# Patient Record
Sex: Male | Born: 1965 | Race: Black or African American | Hispanic: No | Marital: Married | Smoking: Never smoker
Health system: Southern US, Community
[De-identification: ages and names within clinical notes are randomized; demographics above are authoritative.]

## PROBLEM LIST (undated history)

## (undated) DIAGNOSIS — I10 Essential (primary) hypertension: Secondary | ICD-10-CM

## (undated) DIAGNOSIS — E7800 Pure hypercholesterolemia, unspecified: Secondary | ICD-10-CM

---

## 2000-07-06 ENCOUNTER — Emergency Department (HOSPITAL_COMMUNITY): Admission: EM | Admit: 2000-07-06 | Discharge: 2000-07-06 | Payer: Self-pay | Admitting: *Deleted

## 2000-07-06 ENCOUNTER — Encounter: Payer: Self-pay | Admitting: *Deleted

## 2003-08-17 ENCOUNTER — Emergency Department (HOSPITAL_COMMUNITY): Admission: EM | Admit: 2003-08-17 | Discharge: 2003-08-17 | Payer: Self-pay | Admitting: Emergency Medicine

## 2003-08-24 ENCOUNTER — Encounter: Admission: RE | Admit: 2003-08-24 | Discharge: 2003-08-24 | Payer: Self-pay | Admitting: Internal Medicine

## 2003-09-07 ENCOUNTER — Encounter: Admission: RE | Admit: 2003-09-07 | Discharge: 2003-09-07 | Payer: Self-pay | Admitting: Internal Medicine

## 2004-01-19 ENCOUNTER — Encounter: Admission: RE | Admit: 2004-01-19 | Discharge: 2004-01-19 | Payer: Self-pay | Admitting: Internal Medicine

## 2004-02-09 ENCOUNTER — Encounter: Admission: RE | Admit: 2004-02-09 | Discharge: 2004-02-09 | Payer: Self-pay | Admitting: Internal Medicine

## 2004-02-15 ENCOUNTER — Encounter: Admission: RE | Admit: 2004-02-15 | Discharge: 2004-02-15 | Payer: Self-pay | Admitting: Internal Medicine

## 2004-10-26 ENCOUNTER — Ambulatory Visit: Payer: Self-pay | Admitting: Internal Medicine

## 2004-10-26 ENCOUNTER — Inpatient Hospital Stay (HOSPITAL_COMMUNITY): Admission: AD | Admit: 2004-10-26 | Discharge: 2004-10-27 | Payer: Self-pay | Admitting: Internal Medicine

## 2004-10-26 ENCOUNTER — Ambulatory Visit: Payer: Self-pay | Admitting: Cardiology

## 2004-10-31 ENCOUNTER — Encounter (HOSPITAL_COMMUNITY): Admission: RE | Admit: 2004-10-31 | Discharge: 2004-12-21 | Payer: Self-pay | Admitting: Internal Medicine

## 2004-11-06 ENCOUNTER — Ambulatory Visit: Payer: Self-pay | Admitting: Internal Medicine

## 2005-09-08 ENCOUNTER — Emergency Department (HOSPITAL_COMMUNITY): Admission: EM | Admit: 2005-09-08 | Discharge: 2005-09-08 | Payer: Self-pay | Admitting: Emergency Medicine

## 2007-09-01 ENCOUNTER — Emergency Department (HOSPITAL_COMMUNITY): Admission: EM | Admit: 2007-09-01 | Discharge: 2007-09-01 | Payer: Self-pay | Admitting: Emergency Medicine

## 2008-09-08 ENCOUNTER — Emergency Department (HOSPITAL_COMMUNITY): Admission: EM | Admit: 2008-09-08 | Discharge: 2008-09-08 | Payer: Self-pay | Admitting: Emergency Medicine

## 2010-05-06 ENCOUNTER — Emergency Department (HOSPITAL_COMMUNITY): Admission: EM | Admit: 2010-05-06 | Discharge: 2010-05-06 | Payer: Self-pay | Admitting: Emergency Medicine

## 2010-08-02 ENCOUNTER — Encounter: Admission: RE | Admit: 2010-08-02 | Discharge: 2010-08-02 | Payer: Self-pay | Admitting: Family Medicine

## 2010-10-11 ENCOUNTER — Emergency Department (HOSPITAL_BASED_OUTPATIENT_CLINIC_OR_DEPARTMENT_OTHER): Admission: EM | Admit: 2010-10-11 | Discharge: 2010-10-11 | Payer: Self-pay | Admitting: Emergency Medicine

## 2010-10-11 ENCOUNTER — Ambulatory Visit: Payer: Self-pay | Admitting: Radiology

## 2011-03-13 LAB — DIFFERENTIAL
Basophils Relative: 1 % (ref 0–1)
Eosinophils Absolute: 0.3 10*3/uL (ref 0.0–0.7)
Lymphs Abs: 1.8 10*3/uL (ref 0.7–4.0)
Monocytes Absolute: 0.5 10*3/uL (ref 0.1–1.0)
Neutro Abs: 2.5 10*3/uL (ref 1.7–7.7)
Neutrophils Relative %: 50 % (ref 43–77)

## 2011-03-13 LAB — COMPREHENSIVE METABOLIC PANEL
ALT: 19 U/L (ref 0–53)
AST: 21 U/L (ref 0–37)
Albumin: 3.9 g/dL (ref 3.5–5.2)
CO2: 26 mEq/L (ref 19–32)
Creatinine, Ser: 1.57 mg/dL — ABNORMAL HIGH (ref 0.4–1.5)
GFR calc non Af Amer: 49 mL/min — ABNORMAL LOW (ref >60)
Potassium: 4.5 mEq/L (ref 3.5–5.1)
Total Protein: 7.9 g/dL (ref 6.0–8.3)

## 2011-03-13 LAB — URINALYSIS, ROUTINE W REFLEX MICROSCOPIC
Glucose, UA: NEGATIVE mg/dL
Ketones, ur: NEGATIVE mg/dL
Specific Gravity, Urine: 1.01 (ref 1.005–1.030)
Urobilinogen, UA: 0.2 mg/dL (ref 0.0–1.0)

## 2011-03-13 LAB — URINE MICROSCOPIC-ADD ON

## 2011-03-13 LAB — CBC
HCT: 41.4 % (ref 39.0–52.0)
Hemoglobin: 13.8 g/dL (ref 13.0–17.0)
MCHC: 33.4 g/dL (ref 30.0–36.0)
RBC: 5.49 MIL/uL (ref 4.22–5.81)

## 2011-05-11 NOTE — Discharge Summary (Signed)
Christopher Ibarra, Christopher Ibarra           ACCOUNT NO.:  [id number]   MEDICAL RECORD NO.:  [id number]          PATIENT TYPE:  INP   LOCATION:  3701                         FACILITY:  [facility]   PHYSICIAN:  Alvester Morin, M.D.  DATE OF BIRTH:  04/02/1966   DATE OF ADMISSION:  10/26/2004  DATE OF DISCHARGE:  10/27/2004                                 DISCHARGE SUMMARY   RESIDENT:  Dr. Artist Beach   DISCHARGE DIAGNOSES:  1.  Hyperthyroidism.  2.  Hypertension.  3.  Right gluteal abscess.  4.  Sinus tachycardia likely secondary to hyperthyroidism.   DISCHARGE MEDICATIONS:  1.  Atenolol 50 mg p.o. daily.  2.  Doxycycline 100 mg one p.o. b.i.d. for seven days.  3.  Lisinopril 20 mg one tablet p.o. daily.  4.  HCTZ 25 mg one tablet p.o. daily.   DISCHARGE INSTRUCTIONS:  The patient is to follow up in radiology on  October 31, 2004 at 2 p.m. and November 01, 2004 at 2 p.m. for a radioactive  iodine uptake study.  He is to follow up at the [facility]  on November 06, 2004 at 2 p.m. for a hospital discharge follow-up  appointment.   HISTORY OF PRESENT ILLNESS:  This is a 45 year old male with hypertension  who reported to the [facility] on the day of admission for  a blood pressure check following restart of his medications.  Patient had  been off antihypertensives for two weeks.  On the day of admission at his  outpatient clinic appointment patient had reported fatigue and generalized  malaise.  Was noted to have a sinus tachycardia.  Upon review of the  patient's chart patient was noted to have had a persistent tachycardia over  multiple prior visits to the outpatient clinic.  At the outpatient clinic  patient had EKG which showed sinus tachycardia with T-wave inversions in  leads V4-V6.  He was admitted for further work-up.   HOSPITAL COURSE:  #1 - HYPERTHYROIDISM:  Patient's TSH was evaluated upon  admission to the hospital and was found to be 0.006.  He  is being discharged  on a beta blocker 50 mg, p.o. atenolol daily.  He is to follow up as  previously mentioned and discharge instructions for a radioactive iodine  uptake study.  Subsequent to the study consideration will be given for  starting the patient on methimazole.  At the time of discharge free T3 and  free T4 are pending.   #2 - HYPERTENSION:  The patient's systolic blood pressure ranged from 103-  132 during admission, diastolic 88-94.  He was continued on his home dose of  hydrochlorothiazide at 25 mg p.o. daily and also on lisinopril at 20 mg p.o.  daily.  During his hospitalization he was started on metoprolol 25 mg p.o.  b.i.d. for heart rate control and for symptomatic relief of his symptoms due  to his hyperthyroidism.  As previously mentioned, he will be discharged on  atenolol and his blood pressure needs to be thoroughly assessed at his next  outpatient clinic visit on November 06, 2004.   #  3 - RIGHT GLUTEAL ABSCESS:  Patient reported on admission that he had a  roughly three-week history of a right gluteal lesion previously more  inflamed that drained pus.  This region was treated with over-the-counter  Boil-Ease.  Upon admission the lesion is indurated with minimal erythema and  a small drain hole opening.  The lesion does appear to be healing; however,  he is discharged empirically on doxycycline 100 mg p.o. b.i.d. for possible  Staph infection.   #4 - SINUS TACHYCARDIA:  Sinus tachycardia is likely secondary to the  patient's hyperthyroidism.  On admission cardiac enzymes were drawn and the  patient did initially have a mildly elevated troponin I of 0.06.  At the  time of discharge his troponin I is 0.04 with all other cardiac enzymes  within normal limits.  Patient had a mildly elevated D-dimer of 0.73;  however, his tachycardia was not felt to be likely due to a pulmonary  embolism as he was not hypoxic.  Did not complain of chest discomfort and  had been  maintaining a normal activity level on day prior to admission.  His  urine drug screen was negative for any type of stimulant medication,  specifically no cocaine and no amphetamine.  On the day of discharge  laboratories were as follows:  TSH 0.006, free T3 and free T4 pending, urine  drug screen negative, last cardiac panel cardiac enzymes within normal  limits.  At his follow-up appointment on November 14 in the [facility] the following laboratories need to be drawn:  CMET or BMET  to assess for hypokalemia as the patient has recently been restarted on his  antihypertensive medication.  Additionally, if a 2-D echocardiogram has not  been completed prior to his November 06, 2004 appointment, this does need to  be set up for him via the outpatient clinic.       HP/MEDQ  D:  10/27/2004  T:  10/28/2004  Job:  [id number]

## 2012-07-17 ENCOUNTER — Encounter (HOSPITAL_BASED_OUTPATIENT_CLINIC_OR_DEPARTMENT_OTHER): Payer: Self-pay | Admitting: Emergency Medicine

## 2012-07-17 DIAGNOSIS — R509 Fever, unspecified: Secondary | ICD-10-CM | POA: Insufficient documentation

## 2012-07-17 DIAGNOSIS — J029 Acute pharyngitis, unspecified: Secondary | ICD-10-CM | POA: Insufficient documentation

## 2012-07-17 DIAGNOSIS — R0602 Shortness of breath: Secondary | ICD-10-CM | POA: Insufficient documentation

## 2012-07-17 DIAGNOSIS — R52 Pain, unspecified: Secondary | ICD-10-CM | POA: Insufficient documentation

## 2012-07-17 DIAGNOSIS — J3489 Other specified disorders of nose and nasal sinuses: Secondary | ICD-10-CM | POA: Insufficient documentation

## 2012-07-17 DIAGNOSIS — R059 Cough, unspecified: Secondary | ICD-10-CM | POA: Insufficient documentation

## 2012-07-17 DIAGNOSIS — R05 Cough: Secondary | ICD-10-CM | POA: Insufficient documentation

## 2012-07-17 NOTE — ED Notes (Signed)
Pt feeling flulike sx x4 days.  Chills/sweats, sore throat, weakness, dizziness, HA, SOB

## 2012-07-18 ENCOUNTER — Emergency Department (HOSPITAL_BASED_OUTPATIENT_CLINIC_OR_DEPARTMENT_OTHER): Payer: Self-pay

## 2012-07-18 ENCOUNTER — Emergency Department (HOSPITAL_BASED_OUTPATIENT_CLINIC_OR_DEPARTMENT_OTHER)
Admission: EM | Admit: 2012-07-18 | Discharge: 2012-07-18 | Disposition: A | Discharge status: Home / Self Care | Payer: Self-pay | Attending: Emergency Medicine | Admitting: Emergency Medicine

## 2012-07-18 DIAGNOSIS — J40 Bronchitis, not specified as acute or chronic: Secondary | ICD-10-CM

## 2012-07-18 HISTORY — DX: Essential (primary) hypertension: I10

## 2012-07-18 HISTORY — DX: Pure hypercholesterolemia, unspecified: E78.00

## 2012-07-18 LAB — RAPID STREP SCREEN (MED CTR MEBANE ONLY): Streptococcus, Group A Screen (Direct): NEGATIVE

## 2012-07-18 MED ORDER — HYDROCOD POLST-CHLORPHEN POLST 10-8 MG/5ML PO LQCR: ORAL | Status: DC

## 2012-07-18 MED ORDER — AZITHROMYCIN 250 MG PO TABS: 250.0000 mg | ORAL_TABLET | Freq: Every day | ORAL | Status: AC

## 2012-07-18 MED ORDER — ALBUTEROL SULFATE HFA 108 (90 BASE) MCG/ACT IN AERS
2.0000 | INHALATION_SPRAY | RESPIRATORY_TRACT | Status: DC | PRN
  Administered 2012-07-18: 2 via RESPIRATORY_TRACT
  Filled 2012-07-18: qty 6.7

## 2012-07-18 MED ORDER — SODIUM CHLORIDE 0.9 % IV BOLUS (SEPSIS)
1000.0000 mL | Freq: Once | INTRAVENOUS | Status: AC
  Administered 2012-07-18: 1000 mL via INTRAVENOUS

## 2012-07-18 MED ORDER — IBUPROFEN 800 MG PO TABS
800.0000 mg | ORAL_TABLET | Freq: Once | ORAL | Status: AC
  Administered 2012-07-18: 800 mg via ORAL
  Filled 2012-07-18: qty 1

## 2012-07-18 NOTE — ED Notes (Signed)
Patient transported to X-ray 

## 2012-07-18 NOTE — ED Provider Notes (Signed)
History     CSN: [id number]  Arrival date & time 07/17/12  2302   First MD Initiated Contact with Patient 07/18/12 0226      Chief Complaint  Patient presents with  . Flu-like symptoms     (Consider location/radiation/quality/duration/timing/severity/associated sxs/prior treatment) HPI This is a 46 year old black male with a four-day history of flulike symptoms specifically he is having subjective fever, chills, body aches, nasal congestion, sore throat, generalized weakness and malaise, lightheadedness, headache, shortness of breath and persistent cough. He has had 2 episodes of emesis yesterday which he believes were caused by coughing. He has not had abdominal pain or diarrhea. He states the cough is severe and the dyspnea moderate.  Past Medical History  Diagnosis Date  . Hypertension   . High cholesterol     History reviewed. No pertinent past surgical history.  No family history on file.  History  Substance Use Topics  . Smoking status: Never Smoker   . Smokeless tobacco: Never Used  . Alcohol Use: Yes     occasionally      Review of Systems  All other systems reviewed and are negative.    Allergies  Penicillins  Home Medications   Current Outpatient Rx  Name Route Sig Dispense Refill  . AMLODIPINE BESYLATE 10 MG PO TABS Oral Take 10 mg by mouth daily.    Marland Kitchen LISINOPRIL 20 MG PO TABS Oral Take 20 mg by mouth daily.    . NEBIVOLOL HCL 10 MG PO TABS Oral Take 20 mg by mouth daily.    Marland Kitchen SIMVASTATIN 20 MG PO TABS Oral Take 20 mg by mouth daily.      BP 117/99  Pulse 110  Temp 98.9 F (37.2 C) (Oral)  Resp 16  Ht 5\' 10"  (1.778 m)  Wt 207 lb (93.895 kg)  BMI 29.70 kg/m2  SpO2 98%  Physical Exam General: Well-developed, well-nourished male in no acute distress; appearance consistent with age of record HENT: normocephalic, atraumatic; nasal congestion; pharyngeal erythema without exudate Eyes: pupils equal round and reactive to light; extraocular  muscles intact Neck: supple Heart: regular rate and rhythm Lungs: Decreased air movement without wheezing Abdomen: soft; nondistended; nontender; bowel sounds present Extremities: No deformity; full range of motion Neurologic: Awake, alert and oriented; motor function intact in all extremities and symmetric; no facial droop Skin: Warm and dry     ED Course  Procedures (including critical care time)    MDM   Nursing notes and vitals signs, including pulse oximetry, reviewed.  Summary of this visit's results, reviewed by myself:  Labs:  Results for orders placed during the hospital encounter of 07/18/12  RAPID STREP SCREEN      Component Value Range   Streptococcus, Group A Screen (Direct) NEGATIVE  NEGATIVE    Imaging Studies: Dg Chest 2 View  07/18/2012  *RADIOLOGY REPORT*  Clinical Data: Cough, congestion and fever.  CHEST - 2 VIEW  Comparison: Abdominal CT 05/06/2010.  Findings: There is suboptimal inspiration, especially on the frontal examination.  Patchy bibasilar opacities likely represent atelectasis.  No consolidation is identified on the lateral view. The heart size and mediastinal contours are stable for the degree of inspiration.  There is no significant pleural effusion.  The osseous structures appear unchanged.  IMPRESSION: Suboptimal inspiration with resulting bibasilar atelectasis.  No definite focal airspace disease.  If the patient remains symptomatic, radiographic followup should be considered.  Original Report Authenticated By: Gerrianne Scale, M.D.   4:09 AM Feels better  after IV fluid bolus. Air movement improved with improved dyspnea after albuterol treatment.          Hanley Seamen, MD 07/18/12 [phone]

## 2012-07-18 NOTE — ED Notes (Signed)
Pt reports generalized body aches, chills, and weakness for past 4 days. Does report being out in the heat this past week doing yard care and feeling overheated. Pt reports vomiting one time on Wednesday, States that he's been hydrating himself by drinking tea. Pt able to ambulate without difficulty or evidence of weakness.

## 2012-07-18 NOTE — Patient Instructions (Signed)
Instructed pt on the use of administering albuteral mdi via aerochamber pt tolerated well

## 2014-02-13 ENCOUNTER — Encounter (HOSPITAL_BASED_OUTPATIENT_CLINIC_OR_DEPARTMENT_OTHER): Payer: Self-pay | Admitting: Emergency Medicine

## 2014-02-13 ENCOUNTER — Emergency Department (HOSPITAL_BASED_OUTPATIENT_CLINIC_OR_DEPARTMENT_OTHER): Payer: 59

## 2014-02-13 ENCOUNTER — Emergency Department (HOSPITAL_BASED_OUTPATIENT_CLINIC_OR_DEPARTMENT_OTHER)
Admission: EM | Admit: 2014-02-13 | Discharge: 2014-02-13 | Disposition: A | Discharge status: Home / Self Care | Payer: 59 | Attending: Emergency Medicine | Admitting: Emergency Medicine

## 2014-02-13 DIAGNOSIS — M25469 Effusion, unspecified knee: Secondary | ICD-10-CM | POA: Insufficient documentation

## 2014-02-13 DIAGNOSIS — Z880 Allergy status to penicillin: Secondary | ICD-10-CM | POA: Insufficient documentation

## 2014-02-13 DIAGNOSIS — I10 Essential (primary) hypertension: Secondary | ICD-10-CM | POA: Insufficient documentation

## 2014-02-13 DIAGNOSIS — M25569 Pain in unspecified knee: Secondary | ICD-10-CM | POA: Insufficient documentation

## 2014-02-13 DIAGNOSIS — Z79899 Other long term (current) drug therapy: Secondary | ICD-10-CM | POA: Insufficient documentation

## 2014-02-13 DIAGNOSIS — M25561 Pain in right knee: Secondary | ICD-10-CM

## 2014-02-13 DIAGNOSIS — E7800 Pure hypercholesterolemia, unspecified: Secondary | ICD-10-CM | POA: Insufficient documentation

## 2014-02-13 LAB — SYNOVIAL CELL COUNT + DIFF, W/ CRYSTALS
Monocyte-Macrophage-Synovial Fluid: 10 % — ABNORMAL LOW (ref 50–90)
Neutrophil, Synovial: 90 % — ABNORMAL HIGH (ref 0–25)
WBC, Synovial: 11878 /mm3 — ABNORMAL HIGH (ref 0–200)

## 2014-02-13 MED ORDER — OXYCODONE-ACETAMINOPHEN 5-325 MG PO TABS: 1.0000 | ORAL_TABLET | Freq: Four times a day (QID) | ORAL | Status: DC | PRN

## 2014-02-13 MED ORDER — LIDOCAINE HCL 2 % IJ SOLN
INTRAMUSCULAR | Status: AC
  Administered 2014-02-13: 10:00:00
  Filled 2014-02-13: qty 20

## 2014-02-13 MED ORDER — HYDROMORPHONE HCL PF 1 MG/ML IJ SOLN
1.0000 mg | Freq: Once | INTRAMUSCULAR | Status: AC
  Administered 2014-02-13: 1 mg via INTRAMUSCULAR
  Filled 2014-02-13: qty 1

## 2014-02-13 NOTE — ED Notes (Signed)
R knee pain over the past week that has grown worse/swollen. Has been taking ibuprofen but no relief
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
## 2014-02-13 NOTE — ED Notes (Signed)
Arthrocentesis performed by MD, procedure explained, consent signed & time out performed prior to procedure

## 2014-02-13 NOTE — Discharge Instructions (Signed)
Arthralgia  Arthralgia is joint pain. A joint is a place where two bones meet. Joint pain can happen for many reasons. The joint can be bruised, stiff, infected, or weak from aging. Pain usually goes away after resting and taking medicine for soreness.   HOME CARE  · Rest the joint as told by your doctor.  · Keep the sore joint raised (elevated) for the first 24 hours.  · Put ice on the joint area.  · Put ice in a plastic bag.  · Place a towel between your skin and the bag.  · Leave the ice on for 15-20 minutes, 03-04 times a day.  · Wear your splint, casting, elastic bandage, or sling as told by your doctor.  · Only take medicine as told by your doctor. Do not take aspirin.  · Use crutches as told by your doctor. Do not put weight on the joint until told to by your doctor.  GET HELP RIGHT AWAY IF:   · You have bruising, puffiness (swelling), or more pain.  · Your fingers or toes turn blue or start to lose feeling (numb).  · Your medicine does not lessen the pain.  · Your pain becomes severe.  · You have a temperature by mouth above 102° F (38.9° C), not controlled by medicine.  · You cannot move or use the joint.  MAKE SURE YOU:   · Understand these instructions.  · Will watch your condition.  · Will get help right away if you are not doing well or get worse.  Document Released: 11/28/2009 Document Revised: 03/03/2012 Document Reviewed: 11/28/2009  [employer]® Patient Information ©2014 [employer].

## 2014-02-16 LAB — BODY FLUID CULTURE: CULTURE: NO GROWTH

## 2015-09-07 ENCOUNTER — Emergency Department (HOSPITAL_BASED_OUTPATIENT_CLINIC_OR_DEPARTMENT_OTHER)
Admission: EM | Admit: 2015-09-07 | Discharge: 2015-09-07 | Disposition: A | Discharge status: Home / Self Care | Payer: 59 | Attending: Emergency Medicine | Admitting: Emergency Medicine

## 2015-09-07 ENCOUNTER — Emergency Department (HOSPITAL_BASED_OUTPATIENT_CLINIC_OR_DEPARTMENT_OTHER): Payer: 59

## 2015-09-07 ENCOUNTER — Encounter (HOSPITAL_BASED_OUTPATIENT_CLINIC_OR_DEPARTMENT_OTHER): Payer: Self-pay

## 2015-09-07 DIAGNOSIS — M10072 Idiopathic gout, left ankle and foot: Secondary | ICD-10-CM | POA: Diagnosis not present

## 2015-09-07 DIAGNOSIS — E780 Pure hypercholesterolemia: Secondary | ICD-10-CM | POA: Insufficient documentation

## 2015-09-07 DIAGNOSIS — Z79899 Other long term (current) drug therapy: Secondary | ICD-10-CM | POA: Diagnosis not present

## 2015-09-07 DIAGNOSIS — I10 Essential (primary) hypertension: Secondary | ICD-10-CM | POA: Insufficient documentation

## 2015-09-07 DIAGNOSIS — R2242 Localized swelling, mass and lump, left lower limb: Secondary | ICD-10-CM | POA: Diagnosis present

## 2015-09-07 DIAGNOSIS — M109 Gout, unspecified: Secondary | ICD-10-CM

## 2015-09-07 DIAGNOSIS — Z880 Allergy status to penicillin: Secondary | ICD-10-CM | POA: Diagnosis not present

## 2015-09-07 MED ORDER — TRAMADOL HCL 50 MG PO TABS
100.0000 mg | ORAL_TABLET | Freq: Once | ORAL | Status: AC
  Administered 2015-09-07: 100 mg via ORAL
  Filled 2015-09-07: qty 2

## 2015-09-07 MED ORDER — PREDNISONE 20 MG PO TABS: ORAL_TABLET | ORAL | Status: AC

## 2015-09-07 MED ORDER — PREDNISONE 20 MG PO TABS
40.0000 mg | ORAL_TABLET | Freq: Once | ORAL | Status: AC
  Administered 2015-09-07: 40 mg via ORAL
  Filled 2015-09-07: qty 2

## 2015-09-07 MED ORDER — TRAMADOL HCL 50 MG PO TABS: 50.0000 mg | ORAL_TABLET | Freq: Four times a day (QID) | ORAL | Status: AC | PRN

## 2015-09-07 NOTE — ED Provider Notes (Signed)
CSN: [id number]     Arrival date & time 09/07/15  1858 History   This chart was scribed for Arby Barrette, MD by Arlan Organ, ED Scribe. This patient was seen in room MH01/MH01 and the patient's care was started 7:42 PM.   Chief Complaint  Patient presents with  . Joint Swelling   The history is provided by the patient. No language interpreter was used.    HPI Comments: Christopher Ibarra is a 49 y.o. male with a PMHx of HTN, hyperlipidemia, and Gout-knee who presents to the Emergency Department complaining of constant, ongoing L joint pain with associated swelling x over 1 month. Denies any recent injury or trauma, however, he reports he is often on his feet for long hours when at work. Pain is made worse with deep palpation, weight bearing, and ambulation. No alleviating factors at this time. No OTC medications or home remedies attempted prior to arrival. Denies any recent fever or chills. No weakness, loss of sensation, or numbness. Pt with known allergy to Penicillins.  Past Medical History  Diagnosis Date  . Hypertension   . High cholesterol    History reviewed. No pertinent past surgical history. No family history on file. Social History  Substance Use Topics  . Smoking status: Never Smoker   . Smokeless tobacco: Never Used  . Alcohol Use: No    Review of Systems  A complete 10 system review of systems was obtained and all systems are negative except as noted in the HPI and PMH.    Allergies  Penicillins  Home Medications   Prior to Admission medications   Medication Sig Start Date End Date Taking? Authorizing Provider  amLODipine (NORVASC) 10 MG tablet Take 10 mg by mouth daily.    Historical Provider, MD  lisinopril (PRINIVIL,ZESTRIL) 20 MG tablet Take 20 mg by mouth daily.    Historical Provider, MD  nebivolol (BYSTOLIC) 10 MG tablet Take 20 mg by mouth daily.    Historical Provider, MD  simvastatin (ZOCOR) 20 MG tablet Take 20 mg by mouth daily.    Historical  Provider, MD   Triage Vitals: BP 165/101 mmHg  Pulse 63  Temp(Src) 98.4 F (36.9 C) (Oral)  Resp 18  Ht  (1.778 m)  Wt 217 lb (98.431 kg)  BMI 31.14 kg/m2  SpO2 100%   Physical Exam  Constitutional: He is oriented to person, place, and time. He appears well-developed and well-nourished. No distress.  HENT:  Head: Normocephalic and atraumatic.  Eyes: EOM are normal.  Pulmonary/Chest: Effort normal.  Musculoskeletal: He exhibits tenderness. He exhibits no edema.  Left ankle has mild amount of diffuse swelling over the entirety of the joint. There is no erythema or gross effusion. Patient does endorse some diffuse tenderness to palpation over the bony prominences and the posterior aspect of the joint of the Achilles. He has no calf tenderness or lower leg edema. The foot is warm and dry without swelling. Distal pulses 2+ and strong. Range of motion is an intact although patient finds it somewhat uncomfortable to go through range of motion.  Neurological: He is alert and oriented to person, place, and time. Coordination normal.  Skin: Skin is warm and dry.  Psychiatric: He has a normal mood and affect.    ED Course  Procedures (including critical care time)  DIAGNOSTIC STUDIES: Oxygen Saturation is 100% on RA, Normal by my interpretation.    COORDINATION OF CARE: 7:45 PM- Will order DG ankle complete L. Discussed treatment plan with  pt at bedside and pt agreed to plan.     Labs Review Labs Reviewed - No data to display  Imaging Review Dg Ankle Complete Left  09/07/2015   CLINICAL DATA:  Left ankle pain and swelling for 1 week.  EXAM: LEFT ANKLE COMPLETE - 3+ VIEW  COMPARISON:  None.  FINDINGS: The ankle mortise is maintained. No acute ankle fracture or osteochondral lesion. No ankle joint effusion. The mid and hindfoot bony structures are intact.  IMPRESSION: No acute bony findings.   Electronically Signed   By: Rudie Meyer M.D.   On: 09/07/2015 20:19   I have personally  reviewed and evaluated these images and lab results as part of my medical decision-making.   EKG Interpretation None      MDM   Final diagnoses:  Acute gout of left ankle, unspecified cause   Patient has history of gout in his knees previously. He reports this was confirmed by aspiration of joint fluid. At this time he said a month worth of ankle swelling and pain. There is no erythema or significant effusion. X-rays do not show any gross bony normality. Pain is worse by weightbearing and use. At this time differential is for possible gout flare versus degenerative disease of the ankle./ Overuse syndrome. I do feel patient is safe for conservative measures of air splint for temporary immobilization, a brief course a sterile rates and tramadol for pain. He is advised to follow-up with his family physician for response to these treatments.  Arby Barrette, MD 09/07/15 [phone]

## 2015-09-07 NOTE — Discharge Instructions (Signed)
Suspected Gout °Gout is an inflammatory arthritis caused by a buildup of uric acid crystals in the joints. Uric acid is a chemical that is normally present in the blood. When the level of uric acid in the blood is too high it can form crystals that deposit in your joints and tissues. This causes joint redness, soreness, and swelling (inflammation). Repeat attacks are common. Over time, uric acid crystals can form into masses (tophi) near a joint, destroying bone and causing disfigurement. Gout is treatable and often preventable. °CAUSES  °The disease begins with elevated levels of uric acid in the blood. Uric acid is produced by your body when it breaks down a naturally found substance called purines. Certain foods you eat, such as meats and fish, contain high amounts of purines. Causes of an elevated uric acid level include: °· Being passed down from parent to child (heredity). °· Diseases that cause increased uric acid production (such as obesity, psoriasis, and certain cancers). °· Excessive alcohol use. °· Diet, especially diets rich in meat and seafood. °· Medicines, including certain cancer-fighting medicines (chemotherapy), water pills (diuretics), and aspirin. °· Chronic kidney disease. The kidneys are no longer able to remove uric acid well. °· Problems with metabolism. °Conditions strongly associated with gout include: °· Obesity. °· High blood pressure. °· High cholesterol. °· Diabetes. °Not everyone with elevated uric acid levels gets gout. It is not understood why some people get gout and others do not. Surgery, joint injury, and eating too much of certain foods are some of the factors that can lead to gout attacks. °SYMPTOMS  °· An attack of gout comes on quickly. It causes intense pain with redness, swelling, and warmth in a joint. °· Fever can occur. °· Often, only one joint is involved. Certain joints are more commonly involved: °¨ Base of the big  toe. °¨ Knee. °¨ Ankle. °¨ Wrist. °¨ Finger. °Without treatment, an attack usually goes away in a few days to weeks. Between attacks, you usually will not have symptoms, which is different from many other forms of arthritis. °DIAGNOSIS  °Your caregiver will suspect gout based on your symptoms and exam. In some cases, tests may be recommended. The tests may include: °· Blood tests. °· Urine tests. °· X-rays. °· Joint fluid exam. This exam requires a needle to remove fluid from the joint (arthrocentesis). Using a microscope, gout is confirmed when uric acid crystals are seen in the joint fluid. °TREATMENT  °There are two phases to gout treatment: treating the sudden onset (acute) attack and preventing attacks (prophylaxis). °· Treatment of an Acute Attack. °¨ Medicines are used. These include anti-inflammatory medicines or steroid medicines. °¨ An injection of steroid medicine into the affected joint is sometimes necessary. °¨ The painful joint is rested. Movement can worsen the arthritis. °¨ You may use warm or cold treatments on painful joints, depending which works best for you. °· Treatment to Prevent Attacks. °¨ If you suffer from frequent gout attacks, your caregiver may advise preventive medicine. These medicines are started after the acute attack subsides. These medicines either help your kidneys eliminate uric acid from your body or decrease your uric acid production. You may need to stay on these medicines for a very long time. °¨ The early phase of treatment with preventive medicine can be associated with an increase in acute gout attacks. For this reason, during the first few months of treatment, your caregiver may also advise you to take medicines usually used for acute gout treatment. Be sure   you understand your caregiver's directions. Your caregiver may make several adjustments to your medicine dose before these medicines are effective. °¨ Discuss dietary treatment with your caregiver or dietitian.  Alcohol and drinks high in sugar and fructose and foods such as meat, poultry, and seafood can increase uric acid levels. Your caregiver or dietitian can advise you on drinks and foods that should be limited. °HOME CARE INSTRUCTIONS  °· Do not take aspirin to relieve pain. This raises uric acid levels. °· Only take over-the-counter or prescription medicines for pain, discomfort, or fever as directed by your caregiver. °· Rest the joint as much as possible. When in bed, keep sheets and blankets off painful areas. °· Keep the affected joint raised (elevated). °· Apply warm or cold treatments to painful joints. Use of warm or cold treatments depends on which works best for you. °· Use crutches if the painful joint is in your leg. °· Drink enough fluids to keep your urine clear or pale yellow. This helps your body get rid of uric acid. Limit alcohol, sugary drinks, and fructose drinks. °· Follow your dietary instructions. Pay careful attention to the amount of protein you eat. Your daily diet should emphasize fruits, vegetables, whole grains, and fat-free or low-fat milk products. Discuss the use of coffee, vitamin C, and cherries with your caregiver or dietitian. These may be helpful in lowering uric acid levels. °· Maintain a healthy body weight. °SEEK MEDICAL CARE IF:  °· You develop diarrhea, vomiting, or any side effects from medicines. °· You do not feel better in 24 hours, or you are getting worse. °SEEK IMMEDIATE MEDICAL CARE IF:  °· Your joint becomes suddenly more tender, and you have chills or a fever. °MAKE SURE YOU:  °· Understand these instructions. °· Will watch your condition. °· Will get help right away if you are not doing well or get worse. °Document Released: 12/07/2000 Document Revised: 04/26/2014 Document Reviewed: 07/23/2012 °[employer]® Patient Information ©2015 [employer]. This information is not intended to replace advice given to you by your health care provider. Make sure you discuss any  questions you have with your health care provider. ° °

## 2015-09-07 NOTE — ED Notes (Signed)
Left ankle swelling x 1 month-denies injury-steady limping gait

## 2016-07-16 ENCOUNTER — Other Ambulatory Visit: Payer: Self-pay | Admitting: Family Medicine

## 2016-07-16 ENCOUNTER — Ambulatory Visit
Admission: RE | Admit: 2016-07-16 | Discharge: 2016-07-16 | Disposition: A | Discharge status: Home / Self Care | Payer: 59 | Source: Ambulatory Visit | Attending: Family Medicine | Admitting: Family Medicine

## 2016-07-16 DIAGNOSIS — R05 Cough: Secondary | ICD-10-CM

## 2016-07-16 DIAGNOSIS — R059 Cough, unspecified: Secondary | ICD-10-CM

## 2018-06-09 ENCOUNTER — Other Ambulatory Visit: Payer: Self-pay | Admitting: Family Medicine

## 2018-06-09 ENCOUNTER — Ambulatory Visit
Admission: RE | Admit: 2018-06-09 | Discharge: 2018-06-09 | Disposition: A | Discharge status: Home / Self Care | Payer: BLUE CROSS/BLUE SHIELD | Source: Ambulatory Visit | Attending: Family Medicine | Admitting: Family Medicine

## 2018-06-09 DIAGNOSIS — R053 Chronic cough: Secondary | ICD-10-CM

## 2018-06-09 DIAGNOSIS — R05 Cough: Secondary | ICD-10-CM

## 2020-01-20 IMAGING — DX DG CHEST 2V
2 series · 2 of 2 positions shown · non-contrast
Comparison: 07/16/2016

CLINICAL DATA: Prolonged cough and congestion, low-grade fever,
hypertension

EXAM:
CHEST - 2 VIEW

[dg chest 2 view (1 of 2)]
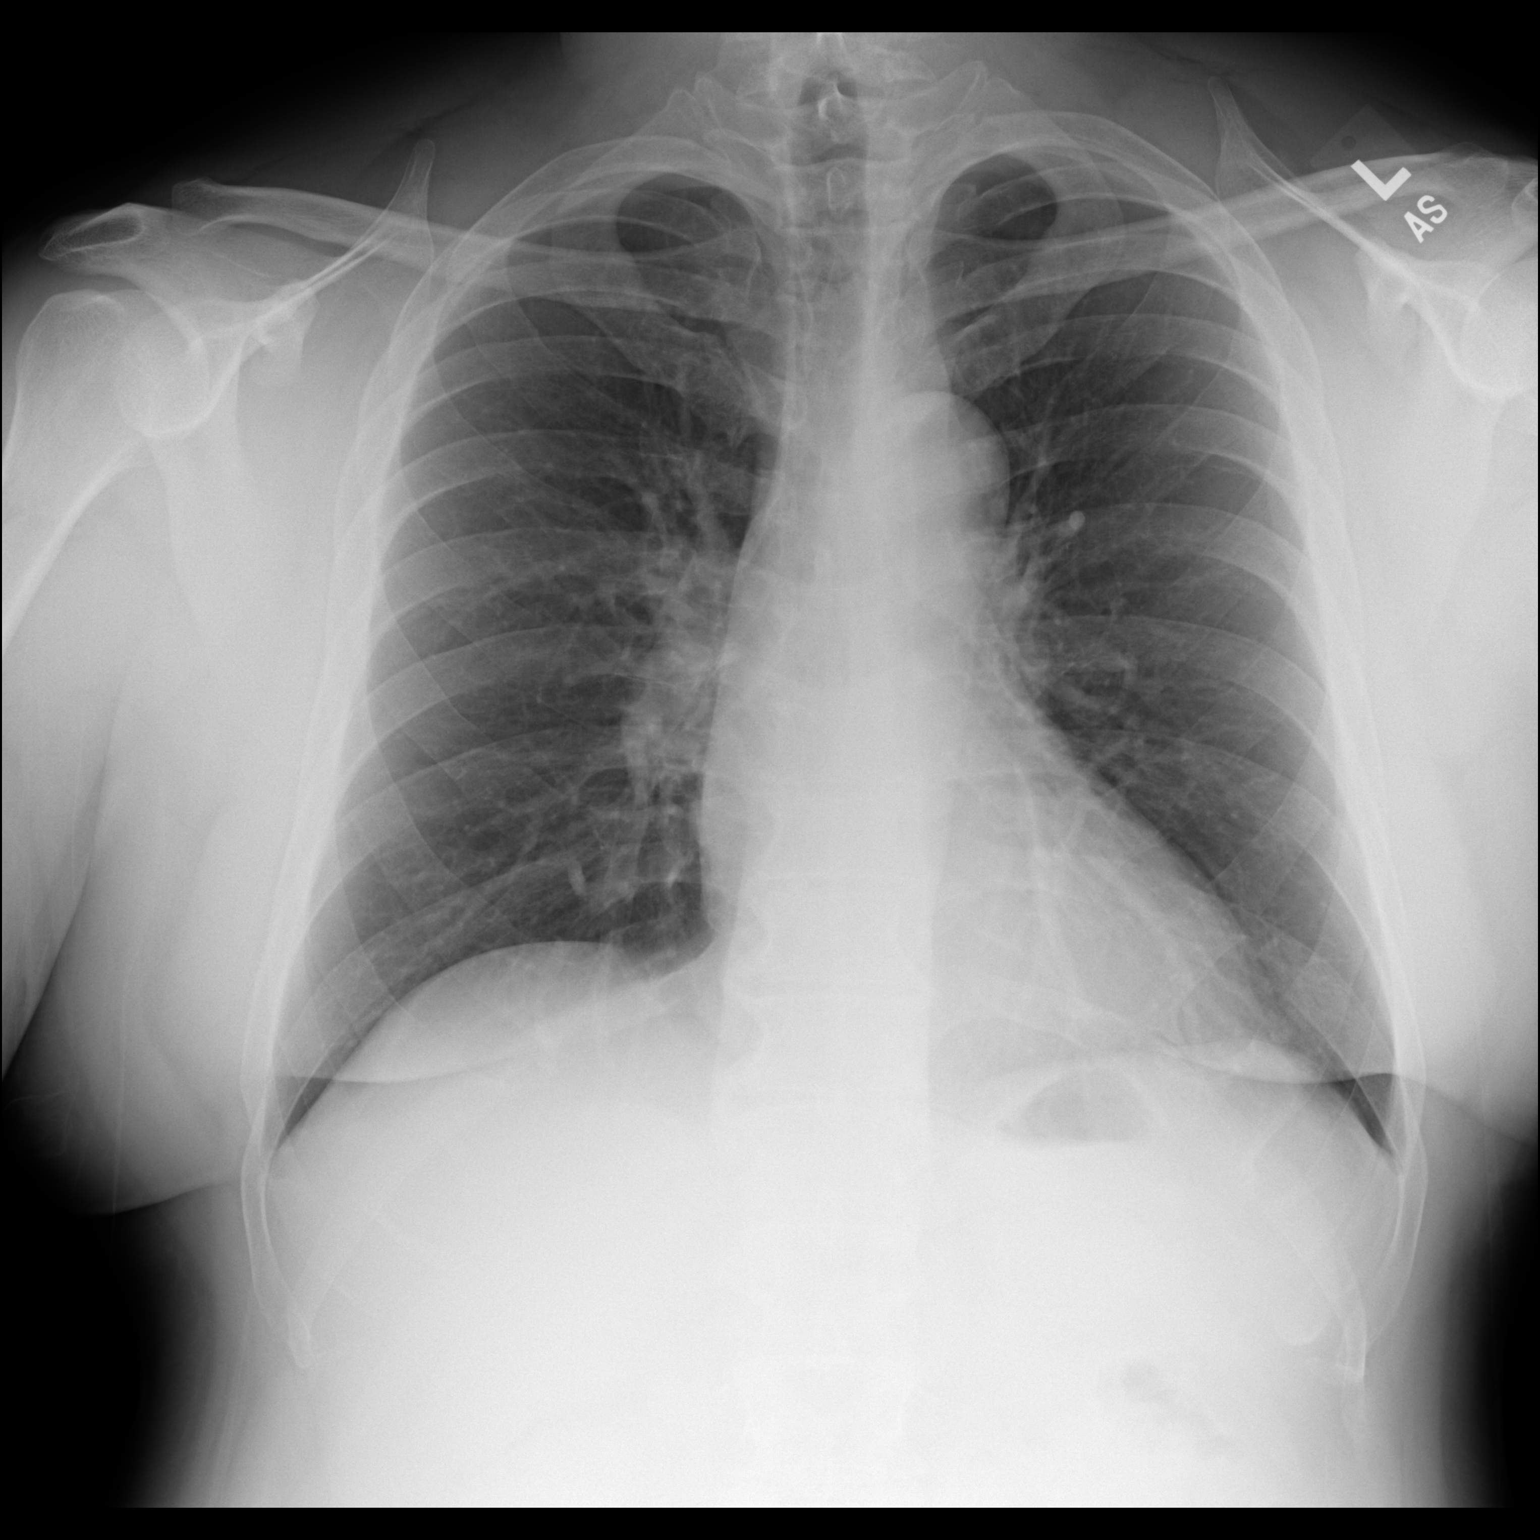

[dg chest 2 view (2 of 2)]
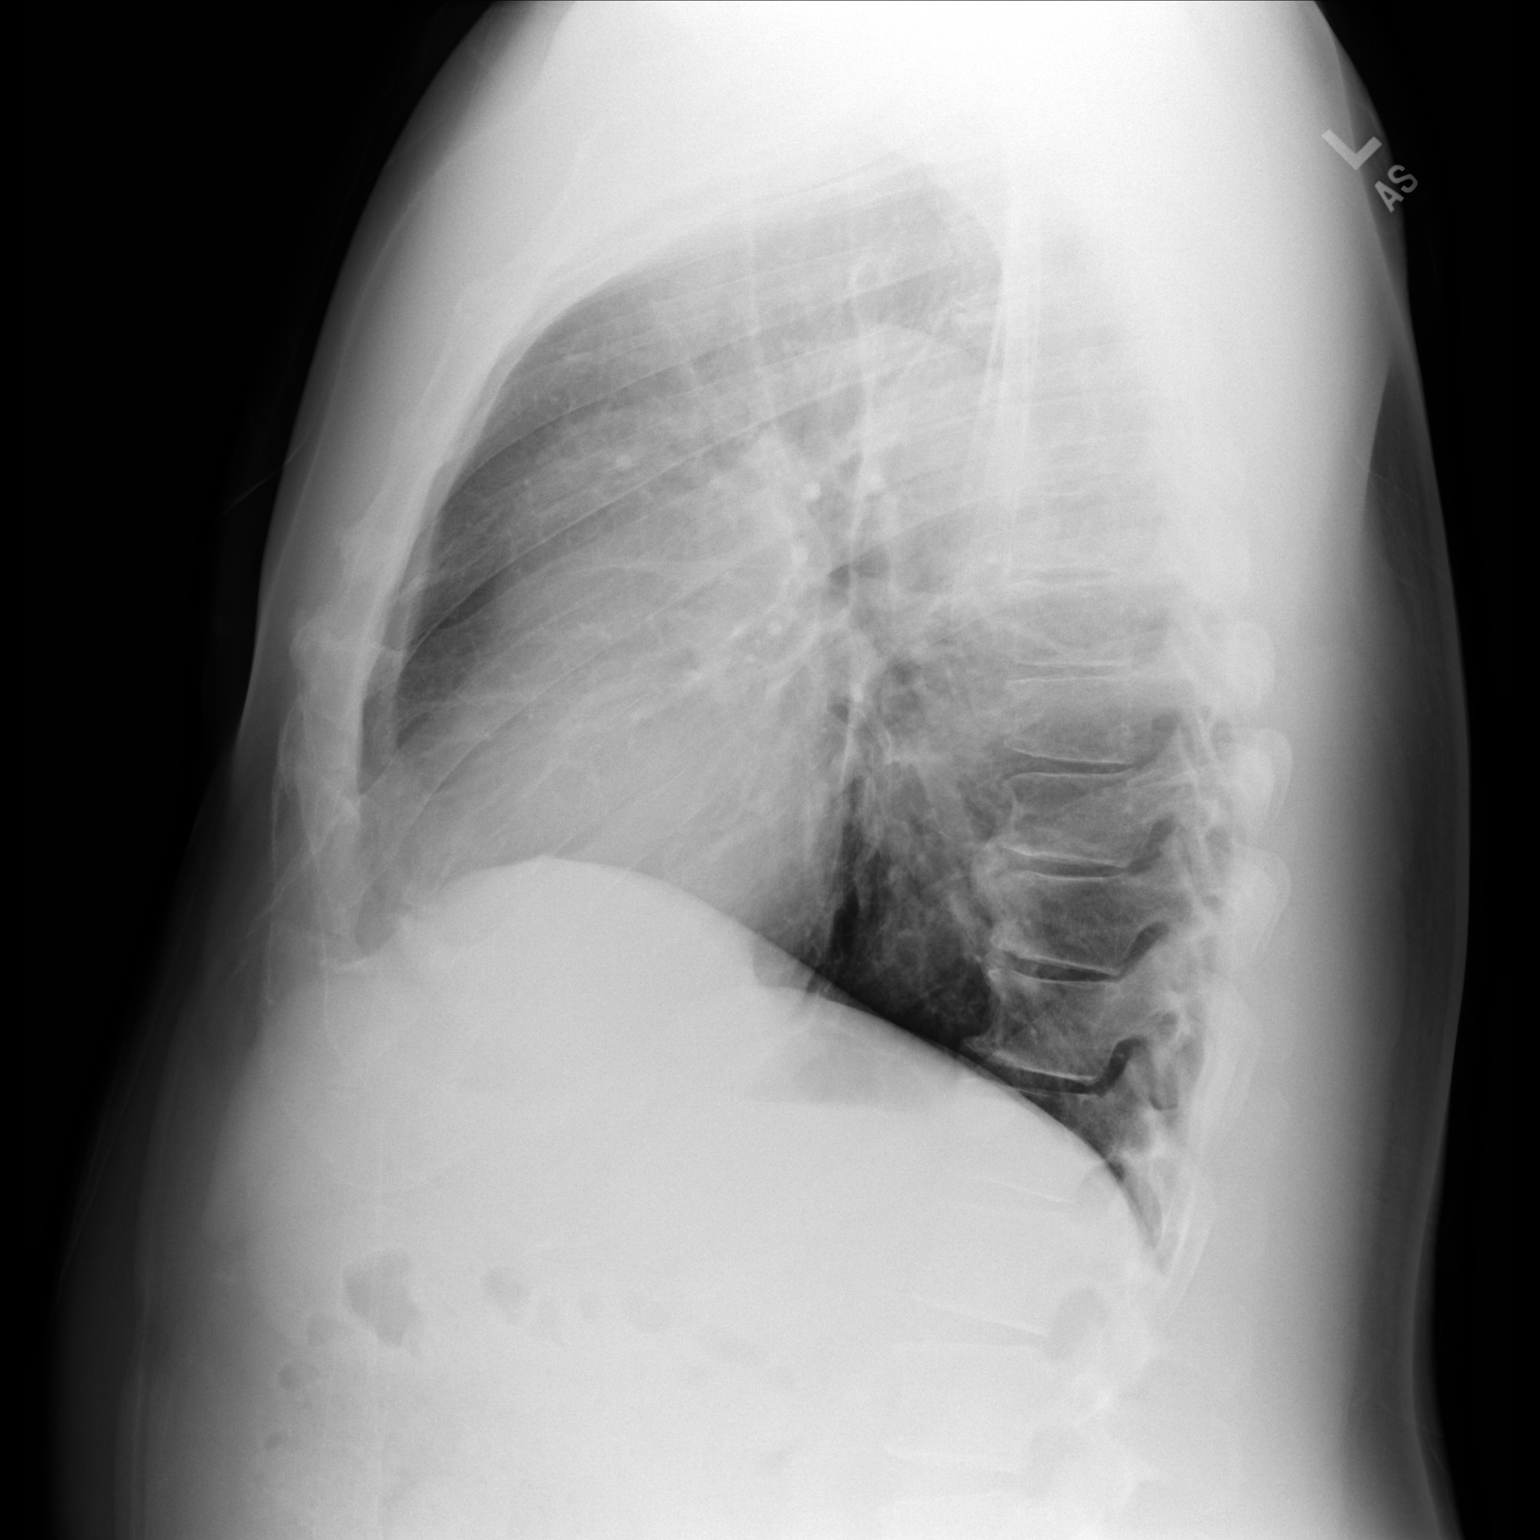

[2 of 2 positions shown; findings below may reference images not displayed]

FINDINGS: Borderline enlargement of cardiac silhouette.

Stable slight fullness of RIGHT hilum versus LEFT.

Mediastinal contours and pulmonary vascularity otherwise normal.

Lungs clear.

No pleural effusion or pneumothorax.

Mild scattered endplate spur formation thoracic spine.
IMPRESSION: No acute abnormalities.

## 2023-05-17 ENCOUNTER — Other Ambulatory Visit: Payer: Self-pay | Admitting: Nephrology

## 2023-05-17 DIAGNOSIS — N184 Chronic kidney disease, stage 4 (severe): Secondary | ICD-10-CM

## 2024-03-18 ENCOUNTER — Other Ambulatory Visit: Payer: Self-pay | Admitting: Internal Medicine

## 2024-03-18 DIAGNOSIS — N184 Chronic kidney disease, stage 4 (severe): Secondary | ICD-10-CM

## 2024-10-08 ENCOUNTER — Other Ambulatory Visit: Payer: Self-pay | Admitting: Internal Medicine

## 2024-10-08 DIAGNOSIS — N184 Chronic kidney disease, stage 4 (severe): Secondary | ICD-10-CM

## 2024-10-19 ENCOUNTER — Ambulatory Visit
Admission: RE | Admit: 2024-10-19 | Discharge: 2024-10-19 | Disposition: A | Discharge status: Home / Self Care | Source: Ambulatory Visit | Attending: Internal Medicine | Admitting: Internal Medicine

## 2024-10-19 DIAGNOSIS — N184 Chronic kidney disease, stage 4 (severe): Secondary | ICD-10-CM
